# Patient Record
Sex: Male | Born: 2005 | Hispanic: Yes | Marital: Single | Smoking: Former smoker
Health system: Southern US, Community
[De-identification: ages and names within clinical notes are randomized; demographics above are authoritative.]

## PROBLEM LIST (undated history)

## (undated) ENCOUNTER — Ambulatory Visit: Admission: EM

## (undated) DIAGNOSIS — J45909 Unspecified asthma, uncomplicated: Secondary | ICD-10-CM

## (undated) DIAGNOSIS — Z789 Other specified health status: Secondary | ICD-10-CM

## (undated) HISTORY — PX: INGUINAL HERNIA REPAIR: SHX194

## (undated) HISTORY — DX: Other specified health status: Z78.9

## (undated) HISTORY — PX: HERNIA REPAIR: SHX51

---
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
## 2023-03-25 ENCOUNTER — Ambulatory Visit
Admission: EM | Admit: 2023-03-25 | Discharge: 2023-03-25 | Disposition: A | Discharge status: Home / Self Care | Payer: Medicaid Other | Attending: Physician Assistant | Admitting: Physician Assistant

## 2023-03-25 ENCOUNTER — Other Ambulatory Visit: Payer: Self-pay

## 2023-03-25 ENCOUNTER — Encounter: Payer: Self-pay | Admitting: *Deleted

## 2023-03-25 DIAGNOSIS — Z113 Encounter for screening for infections with a predominantly sexual mode of transmission: Secondary | ICD-10-CM

## 2023-03-25 MED ORDER — DOXYCYCLINE HYCLATE 100 MG PO CAPS
100.0000 mg | ORAL_CAPSULE | Freq: Two times a day (BID) | ORAL | 0 refills | Status: AC
Start: 2023-03-25 — End: 2023-04-01

## 2023-03-25 NOTE — ED Triage Notes (Addendum)
Pt reports urinary frequency and had some burning with urination last month. Voices concern for chlamydia since he has had it in the past. His last sexual contact was with his ex-girlfriend last month and this is when his symptoms.Pt then states he was with another girl who told him she is positive for chlamydia

## 2023-03-25 NOTE — ED Provider Notes (Signed)
[facility]    CSN: [id number] Arrival date & time: 03/25/23  1744      History   Chief Complaint Chief Complaint  Patient presents with   Urinary Frequency    HPI Chris Sullivan is a 17 y.o. male.   Patient here today for STD screening after possible exposure to chlamydia.  He states that he has had some mild clear discharge that he noted today when he did a swab.  He feels some urinary frequency and burning with urination at times.  He denies any genital lesions or rashes.  The history is provided by the patient.    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 Authorizing Provider  doxycycline (VIBRAMYCIN) 100 MG capsule Take 1 capsule (100 mg total) by mouth 2 (two) times daily for 7 days. 03/25/23 04/01/23 Yes Tomi Bamberger, PA-C  cyclobenzaprine (FLEXERIL) 10 MG tablet Take 1 tablet (10 mg total) by mouth 2 (two) times daily as needed for muscle spasms. 12/16/22   Fayrene Helper, PA-C  famotidine (PEPCID) 20 MG tablet Take 1 tablet (20 mg total) by mouth 2 (two) times daily. 12/15/22   Zenia Resides, MD  escitalopram (LEXAPRO) 5 MG tablet Take 1 tablet (5 mg total) by mouth  daily. 11/07/19 02/03/20  Ettefagh, Aron Baba, MD    Family History Family History  Problem Relation Age of Onset   Healthy Mother    Healthy Father     Social History Social History   Tobacco Use   Smoking status: Some Days   Smokeless tobacco: Never   Tobacco comments:    No smoking   Vaping Use   Vaping status: Every Day   Substances: THC  Substance Use Topics   Alcohol use: Not Currently   Drug use: Not Currently    Frequency: 2.0 times per week    Types: Marijuana    Comment: THC     Allergies   Patient has no known allergies.   Review of Systems Review of Systems  Constitutional:  Negative for chills and fever.  Eyes:  Negative for discharge and redness.  Respiratory:  Negative for shortness of breath.   Genitourinary:  Positive for dysuria, frequency and penile discharge. Negative for genital sores.  Neurological:  Negative for numbness.     Physical Exam Triage Vital Signs ED Triage Vitals  Encounter Vitals Group     BP      Systolic BP Percentile      Diastolic BP Percentile      Pulse      Resp      Temp      Temp src  SpO2      Weight      Height      Head Circumference      Peak Flow      Pain Score      Pain Loc      Pain Education      Exclude from Growth Chart    No data found.  Updated Vital Signs BP 131/75 (BP Location: Right Arm)   Pulse 72   Temp 98 F (36.7 C) (Oral)   Resp 16   SpO2 98%      Physical Exam Vitals and nursing note reviewed.  Constitutional:      General: He is not in acute distress.    Appearance: Normal appearance. He is not ill-appearing.  HENT:     Head: Normocephalic and atraumatic.  Eyes:     Conjunctiva/sclera: Conjunctivae normal.  Cardiovascular:     Rate and Rhythm: Normal rate.  Pulmonary:     Effort: Pulmonary effort is normal. No respiratory distress.  Neurological:     Mental Status: He is alert.  Psychiatric:        Mood and Affect: Mood normal.        Behavior: Behavior  normal.        Thought Content: Thought content normal.      UC Treatments / Results  Labs (all labs ordered are listed, but only abnormal results are displayed) Labs Reviewed  RPR  HIV ANTIBODY (ROUTINE TESTING W REFLEX)  HEPATITIS PANEL, ACUTE  CYTOLOGY, (ORAL, ANAL, URETHRAL) ANCILLARY ONLY    EKG   Radiology No results found.  Procedures Procedures (including critical care time)  Medications Ordered in UC Medications - No data to display  Initial Impression / Assessment and Plan / UC Course  I have reviewed the triage vital signs and the nursing notes.  Pertinent labs & imaging results that were available during my care of the patient were reviewed by me and considered in my medical decision making (see chart for details).    STD screening ordered.  Will treat with doxycycline given reported exposure.  Will await other results further recommendation but encouraged patient to abstain from sexual activity until results are available for review and he has completed doxycycline.  Patient expresses understanding.  Encouraged follow-up with any further concerns.  Final Clinical Impressions(s) / UC Diagnoses   Final diagnoses:  Screening for STD (sexually transmitted disease)   Discharge Instructions   None    ED Prescriptions     Medication Sig Dispense Auth. Provider   doxycycline (VIBRAMYCIN) 100 MG capsule Take 1 capsule (100 mg total) by mouth 2 (two) times daily for 7 days. 14 capsule Tomi Bamberger, PA-C      PDMP not reviewed this encounter.   Tomi Bamberger, PA-C 03/25/23 [phone]

## 2023-03-26 LAB — RPR: RPR Ser Ql: NONREACTIVE

## 2023-03-26 LAB — HIV ANTIBODY (ROUTINE TESTING W REFLEX): HIV Screen 4th Generation wRfx: NONREACTIVE

## 2023-03-29 LAB — CYTOLOGY, (ORAL, ANAL, URETHRAL) ANCILLARY ONLY
Chlamydia: POSITIVE — AB
Comment: NEGATIVE
Comment: NEGATIVE
Comment: NORMAL
Neisseria Gonorrhea: NEGATIVE
Trichomonas: NEGATIVE

## 2023-04-01 ENCOUNTER — Ambulatory Visit: Payer: Medicaid Other | Admitting: Family Medicine

## 2023-04-01 LAB — SPECIMEN STATUS REPORT

## 2023-09-18 ENCOUNTER — Other Ambulatory Visit: Payer: Self-pay

## 2023-09-18 ENCOUNTER — Emergency Department (HOSPITAL_BASED_OUTPATIENT_CLINIC_OR_DEPARTMENT_OTHER)
Admission: EM | Admit: 2023-09-18 | Discharge: 2023-09-18 | Disposition: A | Discharge status: Home / Self Care | Attending: Emergency Medicine | Admitting: Emergency Medicine

## 2023-09-18 DIAGNOSIS — R112 Nausea with vomiting, unspecified: Secondary | ICD-10-CM | POA: Diagnosis present

## 2023-09-18 DIAGNOSIS — F10120 Alcohol abuse with intoxication, uncomplicated: Secondary | ICD-10-CM | POA: Insufficient documentation

## 2023-09-18 MED ORDER — ONDANSETRON 4 MG PO TBDP
4.0000 mg | ORAL_TABLET | Freq: Once | ORAL | Status: AC
  Administered 2023-09-18: 4 mg via ORAL
  Filled 2023-09-18: qty 1

## 2023-09-18 NOTE — ED Provider Notes (Signed)
  [place] EMERGENCY DEPARTMENT AT [facility] Provider Note   CSN: [id number] Arrival date & time: 09/18/23  1922     History  Chief Complaint  Patient presents with   Emesis    Chris Sullivan is a 18 y.o. male.  Otherwise healthy 18 year old male here today for nausea, not sleeping.  Patient drank 10+ beers last evening, did shots of tequila, and had nonhomemade moonshine.   Emesis      Home Medications Prior to Admission medications   Medication Sig Start Date End Date Taking? Authorizing Provider  cyclobenzaprine (FLEXERIL) 10 MG tablet Take 1 tablet (10 mg total) by mouth 2 (two) times daily as needed for muscle spasms. 12/16/22   Fayrene Helper, PA-C  famotidine (PEPCID) 20 MG tablet Take 1 tablet (20 mg total) by mouth 2 (two) times daily. 12/15/22   Zenia Resides, MD  escitalopram (LEXAPRO) 5 MG tablet Take 1 tablet (5 mg total) by mouth daily. 11/07/19 02/03/20  Ettefagh, Aron Baba, MD      Allergies    Patient has no known allergies.    Review of Systems   Review of Systems  Gastrointestinal:  Positive for vomiting.    Physical Exam Updated Vital Signs BP 138/82   Pulse 69   Temp 97.9 F (36.6 C) (Oral)   Resp 16   SpO2 100%  Physical Exam Vitals reviewed.  HENT:     Head: Normocephalic.  Cardiovascular:     Rate and Rhythm: Normal rate.  Pulmonary:     Effort: Pulmonary effort is normal.  Abdominal:     General: Abdomen is flat.     Palpations: Abdomen is soft.     Tenderness: There is no abdominal tenderness.  Musculoskeletal:        General: Normal range of motion.  Skin:    General: Skin is warm.  Neurological:     Mental Status: He is alert.     ED Results / Procedures / Treatments   Labs (all labs ordered are listed, but only abnormal results are displayed) Labs Reviewed - No data to display  EKG None  Radiology No results found.  Procedures Procedures    Medications Ordered in ED Medications   ondansetron (ZOFRAN-ODT) disintegrating tablet 4 mg (4 mg Oral Given 09/18/23 1933)    ED Course/ Medical Decision Making/ A&P                                 Medical Decision Making 18 year old male here today after he drank alcohol last evening and does not feel good today.  Plan-patient with veisalgia.  He has normal vital signs and a reassuring exam.  He received Zofran from triage.  No indication for labs.  Will discharge.  Risk Prescription drug management.           Final Clinical Impression(s) / ED Diagnoses Final diagnoses:  Hangover without complication [facility])    Rx / DC Orders ED Discharge Orders     None         Arletha Pili, DO 09/18/23 1949

## 2023-09-18 NOTE — ED Triage Notes (Signed)
 EtOh last night. Didn't sleep much. Hungover. Ate lunch and belching, emesis after. Denies Abd pain. Afebrile.

## 2023-09-18 NOTE — Discharge Instructions (Addendum)
 Drink water today and tomorrow.  Eat foods that are gentle on the stomach.  Get a good night of sleep tonight.  You will feel better tomorrow.

## 2023-09-22 ENCOUNTER — Ambulatory Visit: Payer: Medicaid Other | Admitting: Family Medicine

## 2023-10-13 ENCOUNTER — Ambulatory Visit (INDEPENDENT_AMBULATORY_CARE_PROVIDER_SITE_OTHER): Admitting: Family Medicine

## 2023-10-13 ENCOUNTER — Encounter: Payer: Self-pay | Admitting: Family Medicine

## 2023-10-13 VITALS — BP 119/75 | HR 61 | Temp 97.9°F | Resp 18 | Ht 66.0 in | Wt 180.1 lb

## 2023-10-13 DIAGNOSIS — Z0000 Encounter for general adult medical examination without abnormal findings: Secondary | ICD-10-CM | POA: Diagnosis not present

## 2023-10-13 DIAGNOSIS — Z13228 Encounter for screening for other metabolic disorders: Secondary | ICD-10-CM

## 2023-10-13 DIAGNOSIS — Z7689 Persons encountering health services in other specified circumstances: Secondary | ICD-10-CM | POA: Diagnosis not present

## 2023-10-13 DIAGNOSIS — Z13220 Encounter for screening for lipoid disorders: Secondary | ICD-10-CM | POA: Diagnosis not present

## 2023-10-13 DIAGNOSIS — Z1159 Encounter for screening for other viral diseases: Secondary | ICD-10-CM | POA: Diagnosis not present

## 2023-10-13 DIAGNOSIS — Z1329 Encounter for screening for other suspected endocrine disorder: Secondary | ICD-10-CM | POA: Insufficient documentation

## 2023-10-13 DIAGNOSIS — Z136 Encounter for screening for cardiovascular disorders: Secondary | ICD-10-CM

## 2023-10-13 NOTE — Progress Notes (Signed)
 Complete physical exam  Patient: Chris Sullivan   DOB: [birth year]   18 y.o. Male  MRN: [id number]  Subjective:    Chief Complaint  Patient presents with   Annual Exam    Chris Sullivan is a 18 y.o. male who presents today for a complete physical exam. He reports consuming a general diet. Home exercise routine includes cardio and strength training. He generally feels fairly well. He reports sleeping fairly well. He does not have additional problems to discuss today.      Most recent fall risk assessment:    10/13/2023    2:59 PM  Fall Risk   Falls in the past year? 0  Number falls in past yr: 0  Injury with Fall? 0  Risk for fall due to : No Fall Risks  Follow up Falls evaluation completed     Most recent depression screenings:    10/13/2023    3:00 PM 08/07/2022    3:26 PM  PHQ 2/9 Scores  PHQ - 2 Score 3 0  PHQ- 9 Score 12 3    Vision:Within last year Has braces, sees orthodontist regularly.     Patient Care Team: Mickiel Albany, FNP as PCP - General (Family Medicine)   Outpatient Medications Prior to Visit  Medication Sig   [DISCONTINUED] cyclobenzaprine  (FLEXERIL ) 10 MG tablet Take 1 tablet (10 mg total) by mouth 2 (two) times daily as needed for muscle spasms.   [DISCONTINUED] famotidine  (PEPCID ) 20 MG tablet Take 1 tablet (20 mg total) by mouth 2 (two) times daily.   No facility-administered medications prior to visit.    Review of Systems  Respiratory:  Negative for shortness of breath.   Cardiovascular:  Negative for chest pain.  Gastrointestinal:  Negative for nausea and vomiting.          Objective:     BP 119/75 (BP Location: Left Arm, Patient Position: Sitting, Cuff Size: Normal)   Pulse 61   Temp 97.9 F (36.6 C) (Oral)   Resp 18   Ht 5\' 6"  (1.676 m)   Wt 180 lb 2 oz (81.7 kg)   SpO2 96%   BMI 29.07 kg/m  BP Readings from Last 3 Encounters:  10/13/23 119/75  09/18/23 (!) 141/81  03/25/23 131/75      Physical  Exam Vitals and nursing note reviewed.  Constitutional:      General: He is not in acute distress.    Appearance: Normal appearance.  HENT:     Right Ear: Tympanic membrane normal.     Left Ear: Tympanic membrane normal.     Nose: Nose normal.     Mouth/Throat:     Mouth: Mucous membranes are moist.     Pharynx: Oropharynx is clear.  Eyes:     Extraocular Movements: Extraocular movements intact.  Neck:     Thyroid: No thyroid tenderness.  Cardiovascular:     Rate and Rhythm: Normal rate and regular rhythm.     Pulses:          Radial pulses are 2+ on the right side and 2+ on the left side.     Heart sounds: Normal heart sounds, S1 normal and S2 normal.  Pulmonary:     Effort: Pulmonary effort is normal.     Breath sounds: Normal breath sounds.  Abdominal:     General: Bowel sounds are normal.     Palpations: Abdomen is soft.     Tenderness: There is no abdominal tenderness.  Musculoskeletal:        General: Normal range of motion.     Cervical back: Normal range of motion.     Right lower leg: No edema.     Left lower leg: No edema.  Lymphadenopathy:     Cervical:     Right cervical: No superficial cervical adenopathy.    Left cervical: No superficial cervical adenopathy.  Skin:    General: Skin is warm and dry.  Neurological:     General: No focal deficit present.     Mental Status: He is alert. Mental status is at baseline.  Psychiatric:        Mood and Affect: Mood normal.        Behavior: Behavior normal.        Thought Content: Thought content normal.        Judgment: Judgment normal.      No results found for any visits on 10/13/23.     Assessment & Plan:    Routine Health Maintenance and Physical Exam  Immunization History  Administered Date(s) Administered   DTaP 09/04/2003, 11/06/2003, 01/01/2004, 10/27/2004, 07/08/2007   HIB (PRP-OMP) 09/04/2003, 11/06/2003, 01/01/2004, 07/15/2004   HPV 9-valent 08/09/2014, 02/05/2015, 07/15/2015   Hepatitis A  06/04/2005, 01/20/2007   Hepatitis B 10/03/2003, 08/07/2003, 01/01/2004   IPV 09/04/2003, 11/06/2003, 10/27/2004, 07/08/2007   Influenza,Quad,Nasal, Live 03/28/2013   Influenza,inj,Quad PF,6+ Mos 05/27/2015, 05/07/2016, 03/26/2017, 03/24/2018, 02/16/2019, 08/07/2022   Influenza,inj,quad, With Preservative 04/12/2014   Influenza-Unspecified 05/01/2004, 07/15/2004, 05/02/2005, 06/19/2006, 03/15/2007, 06/27/2010, 05/09/2011, 07/19/2012   MMR 07/15/2004, 07/08/2007   MenQuadfi_Meningococcal Groups ACYW Conjugate 01/20/2021   Meningococcal Conjugate 08/09/2014   PFIZER(Purple Top)SARS-COV-2 Vaccination 05/28/2020   Pneumococcal-Unspecified 09/04/2003, 11/06/2003, 01/01/2004, 07/15/2004   Tdap 08/09/2014, 07/28/2021   Varicella 07/15/2004, 07/08/2007    Health Maintenance  Topic Date Due   Meningococcal B Vaccine (1 of 2 - Standard) Never done   Hepatitis C Screening  Never done   Pneumococcal Vaccine 7-62 Years old (1 of 2 - PCV) 06/27/2022   COVID-19 Vaccine (2 - 2024-25 season) 02/21/2023   INFLUENZA VACCINE  01/21/2024   DTaP/Tdap/Td (8 - Td or Tdap) 07/29/2031   HPV VACCINES  Completed   HIV Screening  Completed    Discussed health benefits of physical activity, and encouraged him to engage in regular exercise appropriate for his age and condition.  Problem List Items Addressed This Visit     Screening for thyroid disorder - Primary   Relevant Orders   TSH + free T4   Screening for viral disease   Relevant Orders   Hepatitis C antibody    Routine labs ordered.  HCM reviewed/discussed. Hep C ordered today.  Anticipatory guidance regarding healthy weight, lifestyle and choices given. Recommend healthy diet.  Recommend approximately 150 minutes/week of moderate intensity exercise. Resistance training is good for building muscles and for bone health. Muscle mass helps to increase our metabolism and to burn more calories at rest.  Limit alcohol consumption: no more than one drink  per day for women and 2 drinks per day for me. Recommend regular dental and vision exams. Always use seatbelt/lap and shoulder restraints. Recommend using smoke alarms and checking batteries at least twice a year. Recommend using sunscreen when outside.  Please know that I am here to help you with all of your health care goals and am happy to work with you to find a solution that works best for you.  The greatest advice I have received with any changes in life  are to take it one step at a time, that even means if all you can focus on is the next 60 seconds, then do that and celebrate your victories.  With any changes in life, you will have set backs, and that is OK. The important thing to remember is, if you have a set back, it is not a failure, it is an opportunity to try again! Agrees with plan of care discussed.  Questions answered.      Return in about 1 year (around 10/12/2024) for CPE with labs.     Mickiel Albany, FNP

## 2023-10-13 NOTE — Patient Instructions (Addendum)
 [facility]   [address], [place], [place] [address] Phone: [phone]

## 2023-10-14 ENCOUNTER — Encounter: Payer: Self-pay | Admitting: Family Medicine

## 2023-10-14 LAB — CBC WITH DIFFERENTIAL/PLATELET
Basophils Absolute: 0 10*3/uL (ref 0.0–0.2)
Basos: 1 %
EOS (ABSOLUTE): 0.1 10*3/uL (ref 0.0–0.4)
Eos: 1 %
Hematocrit: 45.8 % (ref 37.5–51.0)
Hemoglobin: 15.2 g/dL (ref 13.0–17.7)
Immature Grans (Abs): 0 10*3/uL (ref 0.0–0.1)
Immature Granulocytes: 0 %
Lymphocytes Absolute: 2.2 10*3/uL (ref 0.7–3.1)
Lymphs: 44 %
MCH: 29.7 pg (ref 26.6–33.0)
MCHC: 33.2 g/dL (ref 31.5–35.7)
MCV: 90 fL (ref 79–97)
Monocytes Absolute: 0.5 10*3/uL (ref 0.1–0.9)
Monocytes: 9 %
Neutrophils Absolute: 2.2 10*3/uL (ref 1.4–7.0)
Neutrophils: 45 %
Platelets: 243 10*3/uL (ref 150–450)
RBC: 5.11 x10E6/uL (ref 4.14–5.80)
RDW: 12.8 % (ref 11.6–15.4)
WBC: 5 10*3/uL (ref 3.4–10.8)

## 2023-10-14 LAB — TSH+FREE T4
Free T4: 1.02 ng/dL (ref 0.82–1.77)
TSH: 1.62 u[IU]/mL (ref 0.450–4.500)

## 2023-10-14 LAB — LIPID PANEL
Chol/HDL Ratio: 3.3 ratio (ref 0.0–5.0)
Cholesterol, Total: 151 mg/dL (ref 100–199)
HDL: 46 mg/dL (ref >39)
LDL Chol Calc (NIH): 82 mg/dL (ref 0–99)
Triglycerides: 127 mg/dL (ref 0–149)
VLDL Cholesterol Cal: 23 mg/dL (ref 5–40)

## 2023-10-14 LAB — COMPREHENSIVE METABOLIC PANEL WITH GFR
ALT: 10 IU/L (ref 0–44)
AST: 18 IU/L (ref 0–40)
Albumin: 5.2 g/dL (ref 4.3–5.2)
Alkaline Phosphatase: 117 IU/L (ref 51–125)
BUN/Creatinine Ratio: 11 (ref 9–20)
BUN: 10 mg/dL (ref 6–20)
Bilirubin Total: 0.6 mg/dL (ref 0.0–1.2)
CO2: 24 mmol/L (ref 20–29)
Calcium: 10 mg/dL (ref 8.7–10.2)
Chloride: 102 mmol/L (ref 96–106)
Creatinine, Ser: 0.91 mg/dL (ref 0.76–1.27)
Globulin, Total: 2.3 g/dL (ref 1.5–4.5)
Glucose: 74 mg/dL (ref 70–99)
Potassium: 4.6 mmol/L (ref 3.5–5.2)
Sodium: 141 mmol/L (ref 134–144)
Total Protein: 7.5 g/dL (ref 6.0–8.5)
eGFR: 124 mL/min/{1.73_m2} (ref >59)

## 2023-10-14 LAB — HEMOGLOBIN A1C
Est. average glucose Bld gHb Est-mCnc: 94 mg/dL
Hgb A1c MFr Bld: 4.9 % (ref 4.8–5.6)

## 2023-10-14 LAB — HEPATITIS C ANTIBODY: Hep C Virus Ab: NONREACTIVE

## 2023-11-08 ENCOUNTER — Other Ambulatory Visit: Payer: Self-pay

## 2023-11-08 ENCOUNTER — Emergency Department (HOSPITAL_BASED_OUTPATIENT_CLINIC_OR_DEPARTMENT_OTHER)
Admission: EM | Admit: 2023-11-08 | Discharge: 2023-11-09 | Disposition: A | Discharge status: Home / Self Care | Attending: Emergency Medicine | Admitting: Emergency Medicine

## 2023-11-08 DIAGNOSIS — R0781 Pleurodynia: Secondary | ICD-10-CM | POA: Diagnosis not present

## 2023-11-08 DIAGNOSIS — R002 Palpitations: Secondary | ICD-10-CM | POA: Insufficient documentation

## 2023-11-08 DIAGNOSIS — R0789 Other chest pain: Secondary | ICD-10-CM | POA: Insufficient documentation

## 2023-11-08 LAB — LIPASE, BLOOD: Lipase: 16 U/L (ref 11–51)

## 2023-11-08 LAB — CBC
HCT: 44.5 % (ref 39.0–52.0)
Hemoglobin: 15.6 g/dL (ref 13.0–17.0)
MCH: 29.5 pg (ref 26.0–34.0)
MCHC: 35.1 g/dL (ref 30.0–36.0)
MCV: 84.1 fL (ref 80.0–100.0)
Platelets: 242 10*3/uL (ref 150–400)
RBC: 5.29 MIL/uL (ref 4.22–5.81)
RDW: 12.4 % (ref 11.5–15.5)
WBC: 8.3 10*3/uL (ref 4.0–10.5)
nRBC: 0 % (ref 0.0–0.2)

## 2023-11-08 LAB — COMPREHENSIVE METABOLIC PANEL WITH GFR
ALT: 14 U/L (ref 0–44)
AST: 27 U/L (ref 15–41)
Albumin: 5.1 g/dL — ABNORMAL HIGH (ref 3.5–5.0)
Alkaline Phosphatase: 112 U/L (ref 38–126)
Anion gap: 18 — ABNORMAL HIGH (ref 5–15)
BUN: 9 mg/dL (ref 6–20)
CO2: 21 mmol/L — ABNORMAL LOW (ref 22–32)
Calcium: 10.3 mg/dL (ref 8.9–10.3)
Chloride: 100 mmol/L (ref 98–111)
Creatinine, Ser: 0.9 mg/dL (ref 0.61–1.24)
GFR, Estimated: >60 mL/min (ref >60)
Glucose, Bld: 90 mg/dL (ref 70–99)
Potassium: 3.6 mmol/L (ref 3.5–5.1)
Sodium: 139 mmol/L (ref 135–145)
Total Bilirubin: 1.1 mg/dL (ref 0.0–1.2)
Total Protein: 8 g/dL (ref 6.5–8.1)

## 2023-11-08 NOTE — ED Triage Notes (Signed)
 Anxious. Palpitations. Right sided chest sensation. Denies drug use/abuse. Endorses EtOH yesterday.

## 2023-11-09 NOTE — ED Provider Notes (Signed)
 [place] EMERGENCY DEPARTMENT AT [facility] Provider Note   CSN: [id number] Arrival date & time: 11/08/23  2009     History  Chief Complaint  Patient presents with   Palpitations    Chris Sullivan is a 18 y.o. male.  Patient is a 18 year old male presenting with complaints of palpitations and right sided chest discomfort.  This has been ongoing intermittently for the past month.  He denies any injury or trauma.  No shortness of breath, fevers, chills, or cough.  This pain does seem to be aggravated when he brushes his teeth.  He does report consuming alcohol heavily on the weekends and is concerned this may be contributing.  No alleviating factors.       Home Medications Prior to Admission medications   Medication Sig Start Date End Date Taking? Authorizing Provider  escitalopram  (LEXAPRO ) 5 MG tablet Take 1 tablet (5 mg total) by mouth daily. 11/07/19 02/03/20  Ettefagh, Micah Ade, MD      Allergies    Patient has no known allergies.    Review of Systems   Review of Systems  All other systems reviewed and are negative.   Physical Exam Updated Vital Signs BP (!) 146/87 (BP Location: Right Arm)   Pulse 94   Temp 97.6 F (36.4 C)   Resp 19   SpO2 99%  Physical Exam Vitals and nursing note reviewed.  Constitutional:      General: He is not in acute distress.    Appearance: He is well-developed. He is not diaphoretic.  HENT:     Head: Normocephalic and atraumatic.  Cardiovascular:     Rate and Rhythm: Normal rate and regular rhythm.     Heart sounds: No murmur heard.    No friction rub.  Pulmonary:     Effort: Pulmonary effort is normal. No respiratory distress.     Breath sounds: Normal breath sounds. No wheezing or rales.     Comments: There is reproducible tenderness noted to the right lower anterior ribs.  Breath sounds are clear otherwise. Abdominal:     General: Bowel sounds are normal. There is no distension.     Palpations: Abdomen  is soft.     Tenderness: There is no abdominal tenderness.  Musculoskeletal:        General: Normal range of motion.     Cervical back: Normal range of motion and neck supple.  Skin:    General: Skin is warm and dry.  Neurological:     Mental Status: He is alert and oriented to person, place, and time.     Coordination: Coordination normal.     ED Results / Procedures / Treatments   Labs (all labs ordered are listed, but only abnormal results are displayed) Labs Reviewed  COMPREHENSIVE METABOLIC PANEL WITH GFR - Abnormal; Notable for the following components:      Result Value   CO2 21 (*)    Albumin 5.1 (*)    Anion gap 18 (*)    All other components within normal limits  LIPASE, BLOOD  CBC    EKG None  Radiology No results found.  Procedures Procedures    Medications Ordered in ED Medications - No data to display  ED Course/ Medical Decision Making/ A&P  Patient presenting with right-sided chest discomfort and palpitations as described in the HPI.  He arrives with stable vital signs and is afebrile.  Physical examination reveals reproducible tenderness to the right anterior lower ribs, but is otherwise  unremarkable.  Patient is clinically well-appearing and abdomen is benign.  Laboratory studies obtained including CBC, CMP and lipase.  All of these are unremarkable.  The patient's EKG is also unremarkable.  Symptoms seem very musculoskeletal in nature.  Pain is worse when he brushes his teeth and moves and is reproducible with palpation.  I feel as though he can safely be discharged with NSAIDs, rest, and follow-up as needed.  Final Clinical Impression(s) / ED Diagnoses Final diagnoses:  None    Rx / DC Orders ED Discharge Orders     None         Orvilla Blander, MD 11/09/23 0006

## 2023-11-09 NOTE — Discharge Instructions (Signed)
Begin taking ibuprofen 600 mg 3 times daily for the next 5 days.  Rest.  Return to the emergency department if you develop worsening pain, high fevers, difficulty breathing, or for other new and concerning symptoms.

## 2024-02-10 ENCOUNTER — Ambulatory Visit: Admitting: Family Medicine

## 2024-02-14 ENCOUNTER — Ambulatory Visit: Admitting: Family Medicine

## 2024-02-16 ENCOUNTER — Ambulatory Visit: Admitting: Family Medicine

## 2024-02-18 ENCOUNTER — Telehealth: Payer: Self-pay | Admitting: Family Medicine

## 2024-02-18 NOTE — Telephone Encounter (Signed)
 Copied from CRM [id number]. Topic: General - Running Late >> Feb 16, 2024  2:17 PM Amy B wrote: Patient/patient representative is calling because they are running late for an appointment. Attempted to reschedule but system does not allow.  Patient is on his way.  Dismissed from practice due to 3 no show appointments.

## 2024-02-27 ENCOUNTER — Emergency Department (HOSPITAL_BASED_OUTPATIENT_CLINIC_OR_DEPARTMENT_OTHER)
Admission: EM | Admit: 2024-02-27 | Discharge: 2024-02-27 | Disposition: A | Discharge status: Home / Self Care | Attending: Emergency Medicine | Admitting: Emergency Medicine

## 2024-02-27 ENCOUNTER — Other Ambulatory Visit: Payer: Self-pay

## 2024-02-27 ENCOUNTER — Emergency Department (HOSPITAL_BASED_OUTPATIENT_CLINIC_OR_DEPARTMENT_OTHER)

## 2024-02-27 ENCOUNTER — Encounter (HOSPITAL_BASED_OUTPATIENT_CLINIC_OR_DEPARTMENT_OTHER): Payer: Self-pay | Admitting: Emergency Medicine

## 2024-02-27 DIAGNOSIS — S0083XA Contusion of other part of head, initial encounter: Secondary | ICD-10-CM | POA: Insufficient documentation

## 2024-02-27 DIAGNOSIS — S0990XA Unspecified injury of head, initial encounter: Secondary | ICD-10-CM | POA: Diagnosis not present

## 2024-02-27 NOTE — ED Notes (Signed)
 Patient transported to CT
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
## 2024-02-27 NOTE — Discharge Instructions (Signed)
 Take ibuprofen  600 mg every 6 hours as needed for pain.  Return to the emergency department if you develop severe headache, seizure/convulsions, vomiting, or for other new and concerning symptoms.

## 2024-04-10 ENCOUNTER — Ambulatory Visit: Admission: EM | Admit: 2024-04-10 | Discharge: 2024-04-10 | Disposition: A | Discharge status: Home / Self Care

## 2024-04-10 DIAGNOSIS — Z113 Encounter for screening for infections with a predominantly sexual mode of transmission: Secondary | ICD-10-CM | POA: Insufficient documentation

## 2024-04-10 LAB — POCT URINE DIPSTICK
Bilirubin, UA: NEGATIVE
Blood, UA: NEGATIVE
Glucose, UA: NEGATIVE mg/dL
Ketones, POC UA: NEGATIVE mg/dL
Leukocytes, UA: NEGATIVE
Nitrite, UA: NEGATIVE
POC PROTEIN,UA: NEGATIVE
Spec Grav, UA: 1.025 (ref 1.010–1.025)
Urobilinogen, UA: 0.2 U/dL
pH, UA: 6.5 (ref 5.0–8.0)

## 2024-04-10 NOTE — Discharge Instructions (Addendum)
  1. Screening for STD (sexually transmitted disease) (Primary) - Cytology swab - Urethral; GC / Chlamydia, Trichomonas collected in UC and sent to lab for further testing results should be available in 2 to 3 days if any abnormality is noted patient will be contacted appropriate treatment provided. - Urinalysis completed in UC shows no leukocytes, no nitrite, no blood, these findings are not indicative of urinary infection. - Continue to monitor for any other medical concerns if there is any new symptoms follow-up in ER for further evaluation management.

## 2024-04-10 NOTE — ED Provider Notes (Signed)
 [facility]  Note:  This document was prepared using Conservation officer, historic buildings and may include unintentional dictation errors.  MRN: [id number] DOB: 2005-07-28  Subjective:   Chris Sullivan is a 18 y.o. male presenting for STD screening for possible exposure to chlamydia from his girlfriend approximately a month ago.  Patient reports some intermittent lower back pain but was not sure if that was related to symptoms or something else.  Patient has not taken any over-the-counter medication to treat symptoms patient currently does not have a primary care provider for follow-up.  No dysuria, increased urinary frequency, urinary discharge, penile lesion, abdominal pain, flank pain.  No current facility-administered medications for this encounter. No current outpatient medications on file.   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   Suicide attempt by hanging (HCC) 03/23/2018     Past Surgical History:  Procedure Laterality Date   HERNIA REPAIR     INGUINAL HERNIA REPAIR     KNEE ARTHROSCOPY WITH LATERAL MENISECTOMY Left 04/22/2021   Procedure: KNEE ARTHROSCOPY WITH LATERAL MENISICUS REPAIR MICROFRACTURE;  Surgeon: Beverley Evalene BIRCH, MD;  Location: [facility];  Service: Orthopedics;  Laterality: Left;    Family History  Problem Relation Age of Onset   Healthy Mother    Healthy Father     Social History   Tobacco Use   Smoking status: Former    Types: Cigarettes   Smokeless tobacco: Never   Tobacco comments:    No smoking   Vaping Use   Vaping status: Former   Substances: Nicotine, THC, Flavoring  Substance Use Topics   Alcohol use: Yes    Comment: Sometimes   Drug use: Not Currently    Frequency: 2.0 times per week    Types: Marijuana    Comment: THC    ROS Refer to HPI for ROS  details.  Objective:   Vitals: BP 116/67 (BP Location: Left Arm)   Pulse 74   Temp (!) 97.5 F (36.4 C) (Oral)   Resp 16   Ht 5' 6 (1.676 m)   Wt 180 lb 1.9 oz (81.7 kg)   SpO2 98%   BMI 29.07 kg/m   Physical Exam Vitals and nursing note reviewed.  Constitutional:      General: He is not in acute distress.    Appearance: Normal appearance. He is well-developed. He is not ill-appearing or toxic-appearing.  HENT:     Head: Normocephalic.  Cardiovascular:     Rate and Rhythm: Normal rate.  Pulmonary:     Effort: Pulmonary effort is normal. No respiratory distress.  Abdominal:     Palpations: Abdomen is soft.     Tenderness: There is no abdominal tenderness. There is no right CVA tenderness or left CVA tenderness.  Skin:    General: Skin is warm and dry.  Neurological:     General: No focal deficit present.     Mental Status: He is alert and oriented to person, place, and time.  Psychiatric:        Mood and Affect: Mood normal.        Behavior: Behavior normal.     Procedures  Results for orders placed or performed during the hospital encounter of 04/10/24 (from the past 24 hours)  POCT URINE DIPSTICK     Status: Abnormal   Collection Time: 04/10/24  4:31 PM  Result Value Ref Range   Color, UA yellow yellow   Clarity, UA cloudy (A) clear   Glucose, UA negative negative mg/dL   Bilirubin, UA negative negative   Ketones, POC UA negative negative mg/dL   Spec Grav, UA 8.974 8.989 - 1.025   Blood, UA negative negative   pH, UA 6.5 5.0 - 8.0   POC PROTEIN,UA negative negative, trace   Urobilinogen, UA 0.2 0.2 or 1.0 E.U./dL   Nitrite, UA Negative Negative   Leukocytes, UA Negative Negative    No results found.   Assessment and Plan :     Discharge Instructions       1. Screening for STD (sexually transmitted disease) (Primary) - Cytology swab - Urethral; GC / Chlamydia, Trichomonas collected in UC and sent to lab for further testing results should be  available in 2 to 3 days if any abnormality is noted patient will be contacted appropriate treatment provided. - Urinalysis completed in UC shows no leukocytes, no nitrite, no blood, these findings are not indicative of urinary infection. - Continue to monitor for any other medical concerns if there is any new symptoms follow-up in ER for further evaluation management.        Avi Archuleta B Ezri Fanguy   Timo Hartwig, [place] B, [place] 04/10/24 1701

## 2024-04-10 NOTE — ED Triage Notes (Signed)
 Patient presents for evaluation and treatment following his girlfriend's recent diagnosis of Chlamydia. He attempted to seek care previously but left due to long wait times. It has been approximately one month since the possible exposure. He also reports intermittent side pain. Patient does not currently have a primary care provider.

## 2024-04-13 ENCOUNTER — Ambulatory Visit (HOSPITAL_COMMUNITY): Payer: Self-pay

## 2024-04-13 LAB — CYTOLOGY, (ORAL, ANAL, URETHRAL) ANCILLARY ONLY
Chlamydia: POSITIVE — AB
Comment: NEGATIVE
Comment: NEGATIVE
Comment: NORMAL
Neisseria Gonorrhea: NEGATIVE

## 2024-04-13 MED ORDER — DOXYCYCLINE HYCLATE 100 MG PO TABS
100.0000 mg | ORAL_TABLET | Freq: Two times a day (BID) | ORAL | 0 refills | Status: AC
Start: 2024-04-13 — End: 2024-04-20

## 2024-05-23 ENCOUNTER — Ambulatory Visit: Admission: EM | Admit: 2024-05-23 | Discharge: 2024-05-23 | Discharge status: Against Medical Advice

## 2024-05-23 NOTE — ED Triage Notes (Signed)
 Patient called from lobby x2 (no response). Called by phone (no answer). Removed from board. No response, patient left.

## 2024-05-24 ENCOUNTER — Inpatient Hospital Stay: Admission: RE | Admit: 2024-05-24 | Discharge: 2024-05-24

## 2024-05-24 VITALS — BP 142/85 | HR 80 | Temp 99.5°F | Resp 16

## 2024-05-24 DIAGNOSIS — J069 Acute upper respiratory infection, unspecified: Secondary | ICD-10-CM | POA: Diagnosis not present

## 2024-05-24 DIAGNOSIS — Z202 Contact with and (suspected) exposure to infections with a predominantly sexual mode of transmission: Secondary | ICD-10-CM | POA: Insufficient documentation

## 2024-05-24 NOTE — ED Provider Notes (Signed)
 [facility]    CSN: [id number] Arrival date & time: 05/24/24  1414      History   Chief Complaint Chief Complaint  Patient presents with   Cough    HPI Chris Sullivan is a 18 y.o. male.   Patient presents today due for cough, nasal congestion, and diarrhea for the past 3 days.  Patient states he has been taking over-the-counter cold medications with relief.  Patient also states that he has been having dysuria.  Patient states about a month ago he was diagnosed with chlamydia, states he finished all the  medicine and refrain from sexual activity for 2 weeks.  Patient states that he is currently sexually active with 2 different women and denies use of protection.  Patient denies penile discharge, lower abdominal pain, or back pain.  The history is provided by the patient.  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   Suicide attempt by hanging (HCC) 03/23/2018    Patient Active Problem List   Diagnosis Date Noted   Screening for thyroid  disorder 10/13/2023   Screening for viral disease 10/13/2023   Generalized anxiety disorder 01/21/2021   Nicotine vapor product user 02/15/2019   Eczema 01/02/2015    Past Surgical History:  Procedure Laterality Date   HERNIA REPAIR     INGUINAL HERNIA REPAIR     KNEE ARTHROSCOPY WITH LATERAL MENISECTOMY Left 04/22/2021   Procedure: KNEE ARTHROSCOPY WITH LATERAL MENISICUS REPAIR MICROFRACTURE;  Surgeon: Beverley Evalene BIRCH, MD;  Location: [facility];  Service: Orthopedics;  Laterality: Left;       Home Medications    Prior to Admission medications   Medication Sig Start Date End Date Taking? Authorizing Provider  escitalopram  (LEXAPRO ) 5 MG tablet Take 1 tablet (5 mg total) by mouth daily. 11/07/19 02/03/20  Ettefagh, Mallie Hamilton, MD    Family History Family History   Problem Relation Age of Onset   Healthy Mother    Healthy Father     Social History Social History   Tobacco Use   Smoking status: Former    Types: Cigarettes   Smokeless tobacco: Never   Tobacco comments:    No smoking   Vaping Use   Vaping status: Former   Substances: Nicotine, THC, Flavoring  Substance Use Topics   Alcohol use: Yes    Comment: Sometimes   Drug use: Not Currently    Frequency: 2.0 times per week    Types: Marijuana    Comment: THC     Allergies   Patient has no known allergies.   Review of Systems Review of Systems  Respiratory:  Positive for cough.      Physical Exam Triage Vital Signs ED Triage Vitals [05/24/24 1430]  Encounter Vitals Group     BP (!) 142/85     Girls Systolic BP Percentile      Girls Diastolic BP Percentile      Boys Systolic BP Percentile      Boys Diastolic BP Percentile      Pulse Rate 80     Resp 16     Temp 99.5 F (37.5 C)     Temp Source Oral     SpO2 95 %     Weight      Height      Head Circumference      Peak Flow  Pain Score 0     Pain Loc      Pain Education      Exclude from Growth Chart    No data found.  Updated Vital Signs BP (!) 142/85 (BP Location: Right Arm)   Pulse 80   Temp 99.5 F (37.5 C) (Oral)   Resp 16   SpO2 95%   Visual Acuity Right Eye Distance:   Left Eye Distance:   Bilateral Distance:    Right Eye Near:   Left Eye Near:    Bilateral Near:     Physical Exam Vitals and nursing note reviewed.  Constitutional:      General: He is not in acute distress.    Appearance: Normal appearance. He is not ill-appearing, toxic-appearing or diaphoretic.  HENT:     Right Ear: Tympanic membrane, ear canal and external ear normal.     Left Ear: Tympanic membrane, ear canal and external ear normal.     Nose: No congestion (mildly enlarged turbinates) or rhinorrhea.     Mouth/Throat:     Mouth: Mucous membranes are moist.     Pharynx: Oropharynx is clear. No oropharyngeal  exudate or posterior oropharyngeal erythema.  Eyes:     General: No scleral icterus. Cardiovascular:     Rate and Rhythm: Normal rate and regular rhythm.     Heart sounds: Normal heart sounds.  Pulmonary:     Effort: Pulmonary effort is normal. No respiratory distress.     Breath sounds: Normal breath sounds. No wheezing or rhonchi.  Abdominal:     General: Abdomen is flat. Bowel sounds are normal.     Palpations: Abdomen is soft.     Tenderness: There is no abdominal tenderness. There is no right CVA tenderness or left CVA tenderness.  Skin:    General: Skin is warm.  Neurological:     Mental Status: He is alert and oriented to person, place, and time.  Psychiatric:        Mood and Affect: Mood normal.        Behavior: Behavior normal.      UC Treatments / Results  Labs (all labs ordered are listed, but only abnormal results are displayed) Labs Reviewed  SYPHILIS: RPR W/REFLEX TO RPR TITER AND TREPONEMAL ANTIBODIES, TRADITIONAL SCREENING AND DIAGNOSIS ALGORITHM  HIV ANTIBODY (ROUTINE TESTING W REFLEX)  CYTOLOGY, (ORAL, ANAL, URETHRAL) ANCILLARY ONLY    EKG   Radiology No results found.  Procedures Procedures (including critical care time)  Medications Ordered in UC Medications - No data to display  Initial Impression / Assessment and Plan / UC Course  I have reviewed the triage vital signs and the nursing notes.  Pertinent labs & imaging results that were available during my care of the patient were reviewed by me and considered in my medical decision making (see chart for details).     Patient asked not to be treated for STIs until he gets his results back. Final Clinical Impressions(s) / UC Diagnoses   Final diagnoses:  Contact with and (suspected) exposure to infections with a predominantly sexual mode of transmission  Viral URI     Discharge Instructions      Will wait until results return before giving treatment.  You been diagnosed with a viral  illness today. -Viruses have to run their course and medicines that are prescribed are meant to help with symptoms. - With viruses usually feel poorly from 3 to 7 days with cough being the last symptoms to resolve.  -  Cough can linger from days to weeks.  Antibiotics are not effective for viruses. -If your cough lasts more than 2 weeks and you are coughing so hard that you are vomiting or feel like you could pass out we need to follow-up with PCP for further testing and evaluation. -Rest, increase water intake, may use pseudoephedrine for nasal congestion, Delsym (dextromethorphan) or honey as needed for cough, and ibuprofen  and/or Tylenol  as directed on packaging for pain and fever. -If you have hypertension you should take Coricidin or other OTC meds approved for people with high blood pressure. -You may use a spoonful of honey every 4-6 hours as needed for throat pain and cough. -Warm tea with honey and lemon are helpful for soothe throat as well.  Chloraseptic and Cepacol make a throat lozenge with numbing medication, can be purchased over-the-counter. -May also use Flonase  or sinus rinse for sinus pressure or nasal congestion.  Be sure to use distilled bottled water for sinus rinses. -May use coolmist humidifier to open up nasal passages -May elevate head to assist with postnasal drainage. -If you feel poorly (fever, fatigue, shortness of breath, nausea, etc.) for more than 10 days to be sure to follow-up with PCP or in clinic for further evaluation and additional treatments. If you experience chest pain with shortness of breath or pulse oxygen less than 95% you should report to the ER.     ED Prescriptions   None    PDMP not reviewed this encounter.   Andra Corean BROCKS, PA-C 05/24/24 [phone]

## 2024-05-24 NOTE — Discharge Instructions (Signed)
 Will wait until results return before giving treatment.  You been diagnosed with a viral illness today. -Viruses have to run their course and medicines that are prescribed are meant to help with symptoms. - With viruses usually feel poorly from 3 to 7 days with cough being the last symptoms to resolve.  -Cough can linger from days to weeks.  Antibiotics are not effective for viruses. -If your cough lasts more than 2 weeks and you are coughing so hard that you are vomiting or feel like you could pass out we need to follow-up with PCP for further testing and evaluation. -Rest, increase water intake, may use pseudoephedrine for nasal congestion, Delsym (dextromethorphan) or honey as needed for cough, and ibuprofen  and/or Tylenol  as directed on packaging for pain and fever. -If you have hypertension you should take Coricidin or other OTC meds approved for people with high blood pressure. -You may use a spoonful of honey every 4-6 hours as needed for throat pain and cough. -Warm tea with honey and lemon are helpful for soothe throat as well.  Chloraseptic and Cepacol make a throat lozenge with numbing medication, can be purchased over-the-counter. -May also use Flonase  or sinus rinse for sinus pressure or nasal congestion.  Be sure to use distilled bottled water for sinus rinses. -May use coolmist humidifier to open up nasal passages -May elevate head to assist with postnasal drainage. -If you feel poorly (fever, fatigue, shortness of breath, nausea, etc.) for more than 10 days to be sure to follow-up with PCP or in clinic for further evaluation and additional treatments. If you experience chest pain with shortness of breath or pulse oxygen less than 95% you should report to the ER.

## 2024-05-24 NOTE — ED Triage Notes (Signed)
 Pt states cough congestion and diarrhea for the past 3 days.  Pt also states he would like to be tested for STD's.  States he has a burning sensation when he urinates.

## 2024-05-25 LAB — CYTOLOGY, (ORAL, ANAL, URETHRAL) ANCILLARY ONLY
Chlamydia: NEGATIVE
Comment: NEGATIVE
Comment: NEGATIVE
Comment: NORMAL
Neisseria Gonorrhea: NEGATIVE
Trichomonas: NEGATIVE

## 2024-05-25 LAB — SYPHILIS: RPR W/REFLEX TO RPR TITER AND TREPONEMAL ANTIBODIES, TRADITIONAL SCREENING AND DIAGNOSIS ALGORITHM: RPR Ser Ql: NONREACTIVE

## 2024-05-25 LAB — HIV ANTIBODY (ROUTINE TESTING W REFLEX): HIV Screen 4th Generation wRfx: NONREACTIVE

## 2024-05-31 ENCOUNTER — Encounter (HOSPITAL_BASED_OUTPATIENT_CLINIC_OR_DEPARTMENT_OTHER): Payer: Self-pay | Admitting: Emergency Medicine

## 2024-05-31 ENCOUNTER — Emergency Department (HOSPITAL_BASED_OUTPATIENT_CLINIC_OR_DEPARTMENT_OTHER)
Admission: EM | Admit: 2024-05-31 | Discharge: 2024-05-31 | Disposition: A | Discharge status: Home / Self Care | Attending: Emergency Medicine | Admitting: Emergency Medicine

## 2024-05-31 ENCOUNTER — Other Ambulatory Visit: Payer: Self-pay

## 2024-05-31 ENCOUNTER — Emergency Department (HOSPITAL_BASED_OUTPATIENT_CLINIC_OR_DEPARTMENT_OTHER): Admitting: Radiology

## 2024-05-31 DIAGNOSIS — M940 Chondrocostal junction syndrome [Tietze]: Secondary | ICD-10-CM | POA: Diagnosis not present

## 2024-05-31 DIAGNOSIS — R079 Chest pain, unspecified: Secondary | ICD-10-CM

## 2024-05-31 LAB — BASIC METABOLIC PANEL WITH GFR
Anion gap: 11 (ref 5–15)
BUN: 9 mg/dL (ref 6–20)
CO2: 25 mmol/L (ref 22–32)
Calcium: 9.7 mg/dL (ref 8.9–10.3)
Chloride: 102 mmol/L (ref 98–111)
Creatinine, Ser: 0.75 mg/dL (ref 0.61–1.24)
GFR, Estimated: >60 mL/min (ref >60)
Glucose, Bld: 90 mg/dL (ref 70–99)
Potassium: 4 mmol/L (ref 3.5–5.1)
Sodium: 138 mmol/L (ref 135–145)

## 2024-05-31 LAB — CBC WITH DIFFERENTIAL/PLATELET
Abs Immature Granulocytes: 0.03 K/uL (ref 0.00–0.07)
Basophils Absolute: 0 K/uL (ref 0.0–0.1)
Basophils Relative: 0 %
Eosinophils Absolute: 0.2 K/uL (ref 0.0–0.5)
Eosinophils Relative: 3 %
HCT: 41.3 % (ref 39.0–52.0)
Hemoglobin: 14.1 g/dL (ref 13.0–17.0)
Immature Granulocytes: 0 %
Lymphocytes Relative: 49 %
Lymphs Abs: 3.3 K/uL (ref 0.7–4.0)
MCH: 29.7 pg (ref 26.0–34.0)
MCHC: 34.1 g/dL (ref 30.0–36.0)
MCV: 86.9 fL (ref 80.0–100.0)
Monocytes Absolute: 0.7 K/uL (ref 0.1–1.0)
Monocytes Relative: 10 %
Neutro Abs: 2.5 K/uL (ref 1.7–7.7)
Neutrophils Relative %: 38 %
Platelets: 257 K/uL (ref 150–400)
RBC: 4.75 MIL/uL (ref 4.22–5.81)
RDW: 12.5 % (ref 11.5–15.5)
WBC: 6.7 K/uL (ref 4.0–10.5)
nRBC: 0 % (ref 0.0–0.2)

## 2024-05-31 LAB — TROPONIN T, HIGH SENSITIVITY: Troponin T High Sensitivity: <15 ng/L (ref 0–19)

## 2024-05-31 MED ORDER — IBUPROFEN 400 MG PO TABS
600.0000 mg | ORAL_TABLET | Freq: Once | ORAL | Status: AC
  Administered 2024-05-31: 600 mg via ORAL
  Filled 2024-05-31: qty 1

## 2024-05-31 NOTE — Discharge Instructions (Addendum)
 Your blood work, heart test and chest x-ray were normal. Use Tylenol  every 4 hours and ibuprofen  every 6 as needed for pain. Follow-up with your local primary doctor as needed.

## 2024-05-31 NOTE — ED Provider Notes (Signed)
  EMERGENCY DEPARTMENT AT [facility] Provider Note   CSN: [id number] Arrival date & time: 05/31/24  9292     Patient presents with: Chest Pain   Chris Sullivan is a 17 y.o. male.   Patient presents with central chest pain nonradiating feeling full/ache started 6:00 this morning.  Patient denies any history of similar.  No recent injuries no recent workouts.  Nothing specifically worsens it.  Patient has no significant medical history no concerning family history of cardiac at young age.  Mild congestion recently no significant cough.  No fevers.  No shortness of breath or syncope.  No medical history.  The history is provided by the patient.  Chest Pain Associated symptoms: no abdominal pain, no back pain, no fever, no headache, no shortness of breath and no vomiting        Prior to Admission medications   Medication Sig Start Date End Date Taking? Authorizing Provider  escitalopram  (LEXAPRO ) 5 MG tablet Take 1 tablet (5 mg total) by mouth daily. 11/07/19 02/03/20  Ettefagh, Mallie Hamilton, MD    Allergies: Patient has no known allergies.    Review of Systems  Constitutional:  Negative for chills and fever.  HENT:  Positive for congestion.   Eyes:  Negative for visual disturbance.  Respiratory:  Negative for shortness of breath.   Cardiovascular:  Positive for chest pain.  Gastrointestinal:  Negative for abdominal pain and vomiting.  Genitourinary:  Negative for dysuria and flank pain.  Musculoskeletal:  Negative for back pain, neck pain and neck stiffness.  Skin:  Negative for rash.  Neurological:  Negative for light-headedness and headaches.    Updated Vital Signs BP 126/77   Pulse (!) 54   Temp 97.7 F (36.5 C)   Resp 17   Wt 81.6 kg   SpO2 100%   BMI 29.05 kg/m   Physical Exam Vitals and nursing note reviewed.  Constitutional:      General: He is not in acute distress.    Appearance: He is well-developed.  HENT:     Head:  Normocephalic and atraumatic.     Mouth/Throat:     Mouth: Mucous membranes are moist.  Eyes:     General:        Right eye: No discharge.        Left eye: No discharge.     Conjunctiva/sclera: Conjunctivae normal.  Neck:     Trachea: No tracheal deviation.  Cardiovascular:     Rate and Rhythm: Regular rhythm. Bradycardia present.     Heart sounds: No murmur heard. Pulmonary:     Effort: Pulmonary effort is normal.     Breath sounds: Normal breath sounds.  Abdominal:     General: There is no distension.     Palpations: Abdomen is soft.     Tenderness: There is no abdominal tenderness. There is no guarding.  Musculoskeletal:     Cervical back: Normal range of motion and neck supple. No rigidity.  Skin:    General: Skin is warm.     Capillary Refill: Capillary refill takes less than 2 seconds.     Findings: No rash.  Neurological:     General: No focal deficit present.     Mental Status: He is alert.     Cranial Nerves: No cranial nerve deficit.  Psychiatric:        Mood and Affect: Mood normal.     (all labs ordered are listed, but only abnormal results are displayed) Labs  Reviewed  BASIC METABOLIC PANEL WITH GFR  CBC WITH DIFFERENTIAL/PLATELET  TROPONIN T, HIGH SENSITIVITY    EKG: EKG Interpretation Date/Time:  Wednesday May 31 2024 07:16:35 EST Ventricular Rate:  52 PR Interval:  191 QRS Duration:  95 QT Interval:  426 QTC Calculation: 397 R Axis:   88  Text Interpretation: Sinus rhythm Left atrial enlargement RSR' in V1 or V2, right VCD or RVH Confirmed by Tonia Chew [phone]) on 05/31/2024 7:21:04 AM  Radiology: DG Chest 2 View Result Date: 05/31/2024 EXAM: PA AND LATERAL (2) VIEW(S) XRAY OF THE CHEST 05/31/2024 07:58:11 AM COMPARISON: PA and lateral chest 02/14/2021. CLINICAL HISTORY: cp FINDINGS: LUNGS AND PLEURA: No focal pulmonary opacity. No pleural effusion. No pneumothorax. HEART AND MEDIASTINUM: No acute abnormality of the cardiac and  mediastinal silhouettes. BONES AND SOFT TISSUES: No acute osseous abnormality. IMPRESSION: 1. No evidence of acute chest disease. Stable chest. Electronically signed by: Francis Quam MD 05/31/2024 08:00 AM EST RP Workstation: [id number]     Procedures   Medications Ordered in the ED  ibuprofen  (ADVIL ) tablet 600 mg (600 mg Oral Given 05/31/24 0848)                                    Medical Decision Making Amount and/or Complexity of Data Reviewed Labs: ordered. Radiology: ordered.   Patient presents with focal anterior chest discomfort differential including costochondritis, musculoskeletal, pericarditis, reflux, atypical ACS, other.  Patient has no significant risk factors for ACS or pulm embolism, no shortness of breath no tachycardia PE RC negative.  Ibuprofen  ordered for pain.  Screening blood work to check for signs of anemia, troponin elevation chest x-ray to look for any pleural effusion, cardiomegaly.  Discussed supportive care and follow-up outpatient.  Patient comfortable plan.  EKG independently reviewed sinus rhythm no acute ST elevation or depression normal QT interval.  Blood work results independently reviewed negative troponin electrolytes hemoglobin kidney function unremarkable.  Chest x-ray reviewed independently and no acute abnormalities.  Patient well-appearing stable for outpatient follow-up.    Final diagnoses:  Acute costochondritis  Acute chest pain    ED Discharge Orders     None          Tonia Chew, MD 05/31/24 [id number]

## 2024-05-31 NOTE — ED Triage Notes (Signed)
 Pt c/o central CP that started at 0600 today. Denies n/v, endorses feeling full. Recent sinus congestion, denies cough

## 2024-06-27 ENCOUNTER — Ambulatory Visit
Admission: EM | Admit: 2024-06-27 | Discharge: 2024-06-27 | Disposition: A | Discharge status: Home / Self Care | Attending: Physician Assistant | Admitting: Physician Assistant

## 2024-06-27 ENCOUNTER — Other Ambulatory Visit: Payer: Self-pay

## 2024-06-27 DIAGNOSIS — L0291 Cutaneous abscess, unspecified: Secondary | ICD-10-CM | POA: Diagnosis not present

## 2024-06-27 MED ORDER — CEPHALEXIN 500 MG PO CAPS: 500.0000 mg | ORAL_CAPSULE | Freq: Four times a day (QID) | ORAL | 0 refills | Status: AC

## 2024-06-27 NOTE — ED Triage Notes (Signed)
 Pt presents with a chief complaint of abscess located on right axilla. Noticed about one week ago. Squeezed the area last night and felt the abscess pop. Currently denies pain sitting in triage. Very sore and tender to the touch. No fevers. Applied black seed oil to the area today at 1 PM. Pt is concerned as he feels the abscess has grown in size since he popped it.

## 2024-06-27 NOTE — ED Provider Notes (Signed)
 " [facility]    CSN: [id number] Arrival date & time: 06/27/24  1622      History   Chief Complaint Chief Complaint  Patient presents with   Abscess    HPI Chris Sullivan is a 19 y.o. male.   HPI  Pt is here today with concerns for a bump under his right arm. He reports this has been present for about a week and started to get bigger last night after he was squeezing it and trying to pop it. He states he felt the sensation that I popped internally but no drainage came out.    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 Authorizing Provider  cephALEXin  (KEFLEX ) 500 MG capsule Take 1 capsule (500 mg total) by mouth 4 (four) times daily for 7 days. 06/27/24 07/04/24 Yes Haleigh Desmith E, PA-C  escitalopram  (LEXAPRO ) 5 MG tablet Take 1 tablet (5 mg total) by mouth daily. 11/07/19 02/03/20  Ettefagh, Mallie Hamilton, MD    Family History Family History  Problem Relation Age of Onset   Healthy Mother    Healthy Father     Social History Social History[1]   Allergies   Patient has no known  allergies.   Review of Systems Review of Systems  Skin:        Bump in right axilla      Physical Exam Triage Vital Signs ED Triage Vitals  Encounter Vitals Group     BP 06/27/24 1729 133/75     Girls Systolic BP Percentile --      Girls Diastolic BP Percentile --      Boys Systolic BP Percentile --      Boys Diastolic BP Percentile --      Pulse Rate 06/27/24 1729 71     Resp 06/27/24 1729 16     Temp 06/27/24 1729 98.3 F (36.8 C)     Temp Source 06/27/24 1729 Oral     SpO2 06/27/24 1729 98 %     Weight 06/27/24 1729 180 lb (81.6 kg)     Height 06/27/24 1729 5' 6 (1.676 m)     Head Circumference --      Peak Flow --      Pain Score 06/27/24 1743 0     Pain Loc --      Pain Education --      Exclude from Growth Chart --    No data found.  Updated Vital Signs BP 133/75 (BP Location: Right Arm)   Pulse 71   Temp 98.3 F (36.8 C) (Oral)   Resp 16  Ht 5' 6 (1.676 m)   Wt 180 lb (81.6 kg)   SpO2 98%   BMI 29.05 kg/m   Visual Acuity Right Eye Distance:   Left Eye Distance:   Bilateral Distance:    Right Eye Near:   Left Eye Near:    Bilateral Near:     Physical Exam Vitals reviewed.  Constitutional:      General: He is awake.     Appearance: Normal appearance. He is well-developed and well-groomed.  HENT:     Head: Normocephalic and atraumatic.  Eyes:     Extraocular Movements: Extraocular movements intact.     Conjunctiva/sclera: Conjunctivae normal.  Pulmonary:     Effort: Pulmonary effort is normal.  Musculoskeletal:     Cervical back: Normal range of motion.  Skin:    Findings: Abscess present.      Neurological:     Mental Status: He is alert and oriented to person, place, and time.  Psychiatric:        Attention and Perception: Attention normal.        Mood and Affect: Mood normal.        Speech: Speech normal.        Behavior: Behavior normal. Behavior is cooperative.      UC Treatments / Results  Labs (all labs ordered are  listed, but only abnormal results are displayed) Labs Reviewed - No data to display  EKG   Radiology No results found.  Procedures Procedures (including critical care time)  Medications Ordered in UC Medications - No data to display  Initial Impression / Assessment and Plan / UC Course  I have reviewed the triage vital signs and the nursing notes.  Pertinent labs & imaging results that were available during my care of the patient were reviewed by me and considered in my medical decision making (see chart for details).      Final Clinical Impressions(s) / UC Diagnoses   Final diagnoses:  Abscess  Patient is here today with concerns of an erythematous and slightly painful bump under his right arm has been present for about a week and seem to get bigger last night.  Physical exam is notable for approximately 3 cm diameter mobile mass with overlying redness and tenderness to palpation in the right axilla area.  Based on his presentation as well as symptoms I am concerned for an abscess.  Reviewed with patient that present presentation is not viable for incision and drainage here in clinic but I do recommend starting an antibiotic to assist with resolution.  Reviewed that he should refrain from manipulating or squeezing the area but can apply warm compresses as desired.  Reviewed that if the area becomes fluctuant he can return to clinic for I&D.  ED and return precautions reviewed and provided in AVS.  Follow-up as needed.    Discharge Instructions      You were seen today for concerns of an abscess in your right underarm area.  At this time the area is not appropriate for an incision and drainage but if it starts to form a head or if you start noticing some drainage you can return to the clinic for evaluation.  To help with resolution I am starting you on an antibiotic called Keflex  for you to take by mouth 4 times a day for 7 days.  Please make sure that you finish the entire course  of the antibiotic unless a medical provider tells you to stop or if you develop  an allergic reaction.  You can apply warm compresses to the area, alternate Tylenol  and ibuprofen  as desired for pain, soak the area in warm water and Epsom salt per your preference.  Please do not manipulate or squeeze the area as this can cause worsening pain and cause the infection to go deeper into the tissue.  If you notice any of the following please return here or go to the ER: Copious amounts of drainage or bleeding, severe pain, difficulty moving your arm, fever that is not responding to Tylenol  and ibuprofen      ED Prescriptions     Medication Sig Dispense Auth. Provider   cephALEXin  (KEFLEX ) 500 MG capsule Take 1 capsule (500 mg total) by mouth 4 (four) times daily for 7 days. 28 capsule Halea Lieb E, PA-C      PDMP not reviewed this encounter.     [1]  Social History Tobacco Use   Smoking status: Former    Types: Cigarettes   Smokeless tobacco: Never   Tobacco comments:    No smoking   Vaping Use   Vaping status: Every Day   Substances: Nicotine, THC, Flavoring  Substance Use Topics   Alcohol use: Yes    Comment: Sometimes   Drug use: Not Currently    Frequency: 2.0 times per week    Types: Marijuana    Comment: THC     Jaelah Hauth E, PA-C 06/30/24 1159  "

## 2024-06-27 NOTE — Discharge Instructions (Addendum)
 You were seen today for concerns of an abscess in your right underarm area.  At this time the area is not appropriate for an incision and drainage but if it starts to form a head or if you start noticing some drainage you can return to the clinic for evaluation.  To help with resolution I am starting you on an antibiotic called Keflex  for you to take by mouth 4 times a day for 7 days.  Please make sure that you finish the entire course of the antibiotic unless a medical provider tells you to stop or if you develop an allergic reaction.  You can apply warm compresses to the area, alternate Tylenol  and ibuprofen  as desired for pain, soak the area in warm water and Epsom salt per your preference.  Please do not manipulate or squeeze the area as this can cause worsening pain and cause the infection to go deeper into the tissue.  If you notice any of the following please return here or go to the ER: Copious amounts of drainage or bleeding, severe pain, difficulty moving your arm, fever that is not responding to Tylenol  and ibuprofen 

## 2024-10-12 ENCOUNTER — Encounter: Admitting: Family Medicine
